# Patient Record
Sex: Male | Born: 2000 | Race: Black or African American | Hispanic: No | Marital: Single | Smoking: Never smoker
Health system: Southern US, Community
[De-identification: ages and names within clinical notes are randomized; demographics above are authoritative.]

---
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
## 2018-05-19 ENCOUNTER — Ambulatory Visit (HOSPITAL_COMMUNITY)
Admission: EM | Admit: 2018-05-19 | Discharge: 2018-05-19 | Disposition: A | Discharge status: Home / Self Care | Payer: Self-pay | Attending: Family Medicine | Admitting: Family Medicine

## 2018-05-19 ENCOUNTER — Encounter (HOSPITAL_COMMUNITY): Payer: Self-pay

## 2018-05-19 DIAGNOSIS — R369 Urethral discharge, unspecified: Secondary | ICD-10-CM | POA: Insufficient documentation

## 2018-05-19 DIAGNOSIS — R300 Dysuria: Secondary | ICD-10-CM | POA: Insufficient documentation

## 2018-05-19 NOTE — Discharge Instructions (Signed)
Cytology sent, you will be contacted with any positive results that requires further treatment. Refrain from sexual activity for the next 7 days. Monitor for any worsening of symptoms, fever, abdominal pain, nausea, vomiting, penile lesion/sore, testicular swelling/pain to follow up for reevaluation.

## 2018-05-19 NOTE — ED Triage Notes (Signed)
Pt presents with pain in penis area and pain with urination.

## 2018-05-19 NOTE — ED Provider Notes (Signed)
[facility]    CSN: [id number] Arrival date & time: 05/19/18  1702     History   Chief Complaint Chief Complaint  Patient presents with  . Penis problem    HPI Jeffrey Hood is a 17 y.o. male.   17 year old male comes in for 3 day history of penile discharge, painful urination.  Denies urinary frequency, hematuria.  Denies abdominal pain, nausea, vomiting.  Denies fever, chills, night sweats.  Denies penile lesion, testicular swelling, testicular pain.  He is sexually active with one male partner, no condom use.     History reviewed. No pertinent past medical history.  There are no active problems to display for this patient.   History reviewed. No pertinent surgical history.     Home Medications    Prior to Admission medications   Medication Sig Start Date End Date Taking? Authorizing Provider  ibuprofen (ADVIL,MOTRIN) 200 MG tablet Take 200 mg by mouth every 6 (six) hours as needed.    [provider]    Family History Family History  Problem Relation Age of Onset  . Healthy Mother     Social History Social History   Tobacco Use  . Smoking status: Never Smoker  . Smokeless tobacco: Never Used  Substance Use Topics  . Alcohol use: Yes  . Drug use: Never     Allergies   Patient has no known allergies.   Review of Systems Review of Systems  Reason unable to perform ROS: See HPI as above.     Physical Exam Triage Vital Signs ED Triage Vitals  Enc Vitals Group     BP 05/19/18 1722 121/84     Pulse Rate 05/19/18 1722 90     Resp 05/19/18 1722 20     Temp 05/19/18 1722 98.4 F (36.9 C)     Temp Source 05/19/18 1722 Oral     SpO2 05/19/18 1722 100 %     Weight --      Height --      Head Circumference --      Peak Flow --      Pain Score 05/19/18 1719 5     Pain Loc --      Pain Edu? --      Excl. in GC? --    No data found.  Updated Vital Signs BP 121/84 (BP Location: Left Arm)   Pulse 90   Temp 98.4 F (36.9  C) (Oral)   Resp 20   SpO2 100%   Physical Exam  Constitutional: He is oriented to person, place, and time. He appears well-developed and well-nourished. No distress.  HENT:  Head: Normocephalic and atraumatic.  Eyes: Pupils are equal, round, and reactive to light. Conjunctivae are normal.  Neurological: He is alert and oriented to person, place, and time.  Skin: He is not diaphoretic.     UC Treatments / Results  Labs (all labs ordered are listed, but only abnormal results are displayed) Labs Reviewed  URINE CYTOLOGY ANCILLARY ONLY    EKG None  Radiology No results found.  Procedures Procedures (including critical care time)  Medications Ordered in UC Medications - No data to display  Initial Impression / Assessment and Plan / UC Course  I have reviewed the triage vital signs and the nursing notes.  Pertinent labs & imaging results that were available during my care of the patient were reviewed by me and considered in my medical decision making (see chart for details).  Patient would like to wait for cytology results for treatment.  Offered HIV testing due to MSM.  Patient would like to defer for now due to cost.  Will advise patient to go to health department for further testing needed. Cytology sent, patient will be contacted with any positive results that require additional treatment. Patient to refrain from sexual activity for the next 7 days. Return precautions given.    Final Clinical Impressions(s) / UC Diagnoses   Final diagnoses:  Penile discharge    ED Prescriptions    None        Belinda FisherYu, Amy V, PA-C 05/19/18 1840

## 2018-05-20 LAB — URINE CYTOLOGY ANCILLARY ONLY
CHLAMYDIA, DNA PROBE: NEGATIVE
NEISSERIA GONORRHEA: POSITIVE — AB
Trichomonas: NEGATIVE

## 2018-05-22 ENCOUNTER — Telehealth (HOSPITAL_COMMUNITY): Payer: Self-pay

## 2018-05-22 ENCOUNTER — Ambulatory Visit (HOSPITAL_COMMUNITY)
Admission: EM | Admit: 2018-05-22 | Discharge: 2018-05-22 | Disposition: A | Discharge status: Home / Self Care | Payer: Self-pay | Attending: Internal Medicine | Admitting: Internal Medicine

## 2018-05-22 DIAGNOSIS — A549 Gonococcal infection, unspecified: Secondary | ICD-10-CM

## 2018-05-22 MED ORDER — CEFTRIAXONE SODIUM 250 MG IJ SOLR
INTRAMUSCULAR | Status: AC
  Filled 2018-05-22: qty 250

## 2018-05-22 MED ORDER — AZITHROMYCIN 250 MG PO TABS
ORAL_TABLET | ORAL | Status: AC
  Filled 2018-05-22: qty 4

## 2018-05-22 MED ORDER — AZITHROMYCIN 250 MG PO TABS
1000.0000 mg | ORAL_TABLET | Freq: Once | ORAL | Status: AC
  Administered 2018-05-22: 1000 mg via ORAL

## 2018-05-22 MED ORDER — CEFTRIAXONE SODIUM 250 MG IJ SOLR
250.0000 mg | Freq: Once | INTRAMUSCULAR | Status: AC
  Administered 2018-05-22: 250 mg via INTRAMUSCULAR

## 2018-05-22 NOTE — Telephone Encounter (Signed)
Gonorrhea is positive.  Patient should return as soon as possible to the urgent care for treatment with IM rocephin 250mg  and po zithromax 1g. Patient will not need to see a provider unless there are new symptoms she would like evaluated. Pt called and made aware, educated patient to refrain from sexual intercourse for now and for 7 days after treatment to give the medicine time to work.  Sexual partners need to be notified and tested/treated.  Condoms may reduce risk of reinfection. Answered all patient questions. [facility] notified.

## 2018-05-22 NOTE — ED Notes (Signed)
Bed: UC01 Expected date:  Expected time:  Means of arrival:  Comments: 

## 2018-05-22 NOTE — ED Notes (Signed)
Pt here to get std treatment
